# Patient Record
Sex: Male | Born: 1965 | Race: Black or African American | Hispanic: No | Marital: Single | Smoking: Never smoker
Health system: Southern US, Community
[De-identification: ages and names within clinical notes are randomized; demographics above are authoritative.]

---

## 2016-10-01 ENCOUNTER — Emergency Department (HOSPITAL_BASED_OUTPATIENT_CLINIC_OR_DEPARTMENT_OTHER)
Admission: EM | Admit: 2016-10-01 | Discharge: 2016-10-01 | Disposition: A | Discharge status: Home / Self Care | Payer: BLUE CROSS/BLUE SHIELD | Attending: Emergency Medicine | Admitting: Emergency Medicine

## 2016-10-01 ENCOUNTER — Encounter (HOSPITAL_BASED_OUTPATIENT_CLINIC_OR_DEPARTMENT_OTHER): Payer: Self-pay | Admitting: *Deleted

## 2016-10-01 DIAGNOSIS — M791 Myalgia: Secondary | ICD-10-CM | POA: Diagnosis not present

## 2016-10-01 DIAGNOSIS — N390 Urinary tract infection, site not specified: Secondary | ICD-10-CM | POA: Diagnosis not present

## 2016-10-01 DIAGNOSIS — N3000 Acute cystitis without hematuria: Secondary | ICD-10-CM

## 2016-10-01 LAB — URINALYSIS, MICROSCOPIC (REFLEX)

## 2016-10-01 LAB — URINALYSIS, ROUTINE W REFLEX MICROSCOPIC
Glucose, UA: NEGATIVE mg/dL
Ketones, ur: 15 mg/dL — AB
Nitrite: POSITIVE — AB
PROTEIN: 100 mg/dL — AB
Specific Gravity, Urine: 1.04 — ABNORMAL HIGH (ref 1.005–1.030)
pH: 6 (ref 5.0–8.0)

## 2016-10-01 MED ORDER — ACETAMINOPHEN 500 MG PO TABS
1000.0000 mg | ORAL_TABLET | Freq: Once | ORAL | Status: AC
  Administered 2016-10-01: 1000 mg via ORAL
  Filled 2016-10-01: qty 2

## 2016-10-01 MED ORDER — CIPROFLOXACIN HCL 500 MG PO TABS: 500.0000 mg | ORAL_TABLET | Freq: Two times a day (BID) | ORAL | 0 refills | Status: AC

## 2016-10-01 NOTE — Discharge Instructions (Signed)
Cipro as prescribed.  Tylenol 1000 mg rotated with ibuprofen 600 mg every 4 hours as needed for fever.  Drink plenty of fluids and get plenty of rest.  Return to the emergency department if your symptoms significantly worsen or change.

## 2016-10-01 NOTE — ED Triage Notes (Signed)
Body aches and dark urine x 4 days.

## 2016-10-01 NOTE — ED Provider Notes (Signed)
[facility]   CSN: [id number] Arrival date & time: 10/01/16  1805  By signing my name below, I, Diona BrownerJennifer Gorman, attest that this documentation has been prepared under the direction and in the presence of Geoffery Lyonselo, Osmar Howton, MD. Electronically Signed: Diona BrownerJennifer Gorman, ED Scribe. 10/01/16. 7:01 PM.  History   Chief Complaint Chief Complaint  Patient presents with  . Generalized Body Aches    HPI Darryl Fritz is a 51 y.o. male who presents to the Emergency Department complaining of body aches since Monday, 09/27/16. Pt originally thought he was getting sick. Associated sx include dark urine, chills, HA, lower abdominal pain, back pain, and fever. He was constipated for the last few days and notes relief yesterday. He had a tick on the back side of his right leg ~ a month ago, but hasn't seen any one him since. No medications taken at home. No past surgeries noted. Pt denies dysuria, and difficulty urinating.  The history is provided by the patient. No language interpreter was used.    History reviewed. No pertinent past medical history.  There are no active problems to display for this patient.   History reviewed. No pertinent surgical history.     Home Medications    Prior to Admission medications   Not on File    Family History No family history on file.  Social History Social History  Substance Use Topics  . Smoking status: Never Smoker  . Smokeless tobacco: Never Used  . Alcohol use Yes     Allergies   Patient has no known allergies.   Review of Systems Review of Systems  Constitutional: Positive for chills and fever.  Gastrointestinal: Positive for abdominal pain.  Genitourinary: Negative for difficulty urinating and dysuria.  Musculoskeletal: Positive for arthralgias, back pain and myalgias.  Neurological: Positive for headaches.  All other systems reviewed and are negative.    Physical Exam Updated Vital Signs BP 133/85    Pulse 90   Temp (!) 100.5 F (38.1 C) (Oral)   Resp 20   Ht 5\' 8"  (1.727 m)   Wt 187 lb (84.8 kg)   SpO2 99%   BMI 28.43 kg/m   Physical Exam  Constitutional: He is oriented to person, place, and time. He appears well-developed and well-nourished.  HENT:  Head: Normocephalic and atraumatic.  Eyes: EOM are normal.  Neck: Normal range of motion.  Cardiovascular: Normal rate, regular rhythm, normal heart sounds and intact distal pulses.   Pulmonary/Chest: Effort normal and breath sounds normal. No respiratory distress.  Abdominal: Soft. He exhibits no distension. There is no tenderness.  Musculoskeletal: Normal range of motion.  Neurological: He is alert and oriented to person, place, and time.  Skin: Skin is warm and dry.  Psychiatric: He has a normal mood and affect. Judgment normal.  Nursing note and vitals reviewed.    ED Treatments / Results  DIAGNOSTIC STUDIES: Oxygen Saturation is 99% on RA, normal by my interpretation.   COORDINATION OF CARE: 7:01 PM-Discussed next steps with pt which includes taking Cipro to help with his sx. Tylenol to help his fever and to drink plenty of fluids. Pt verbalized understanding and is agreeable with the plan.   Labs (all labs ordered are listed, but only abnormal results are displayed) Labs Reviewed  URINALYSIS, ROUTINE W REFLEX MICROSCOPIC - Abnormal; Notable for the following:       Result Value   Color, Urine ORANGE (*)    APPearance CLOUDY (*)  Specific Gravity, Urine 1.040 (*)    Hgb urine dipstick MODERATE (*)    Bilirubin Urine MODERATE (*)    Ketones, ur 15 (*)    Protein, ur 100 (*)    Nitrite POSITIVE (*)    Leukocytes, UA SMALL (*)    All other components within normal limits  URINALYSIS, MICROSCOPIC (REFLEX) - Abnormal; Notable for the following:    Bacteria, UA FEW (*)    Squamous Epithelial / LPF 0-5 (*)    All other components within normal limits    EKG  EKG Interpretation None       Radiology No  results found.  Procedures Procedures (including critical care time)  Medications Ordered in ED Medications  acetaminophen (TYLENOL) tablet 1,000 mg (1,000 mg Oral Given 10/01/16 1814)     Initial Impression / Assessment and Plan / ED Course  I have reviewed the triage vital signs and the nursing notes.  Pertinent labs & imaging results that were available during my care of the patient were reviewed by me and considered in my medical decision making (see chart for details).  Patient presents with pelvic discomfort and fever. He noticed today that his urine became somewhat dark. His UA is suggestive of a urinary tract infection. He will be treated with Cipro and follow-up as needed. The patient is febrile here in the ER, however his vitals are otherwise stable and he does not appear toxic or significantly ill.  Final Clinical Impressions(s) / ED Diagnoses   Final diagnoses:  None    New Prescriptions New Prescriptions   No medications on file   I personally performed the services described in this documentation, which was scribed in my presence. The recorded information has been reviewed and is accurate.       Geoffery Lyons, MD 10/01/16 2128

## 2017-03-03 DIAGNOSIS — K921 Melena: Secondary | ICD-10-CM | POA: Diagnosis not present

## 2017-03-03 DIAGNOSIS — R3912 Poor urinary stream: Secondary | ICD-10-CM | POA: Diagnosis not present

## 2017-03-03 DIAGNOSIS — E782 Mixed hyperlipidemia: Secondary | ICD-10-CM | POA: Diagnosis not present

## 2017-03-03 DIAGNOSIS — Z0000 Encounter for general adult medical examination without abnormal findings: Secondary | ICD-10-CM | POA: Diagnosis not present

## 2017-08-30 ENCOUNTER — Emergency Department (HOSPITAL_BASED_OUTPATIENT_CLINIC_OR_DEPARTMENT_OTHER): Payer: BLUE CROSS/BLUE SHIELD

## 2017-08-30 ENCOUNTER — Encounter (HOSPITAL_BASED_OUTPATIENT_CLINIC_OR_DEPARTMENT_OTHER): Payer: Self-pay | Admitting: Emergency Medicine

## 2017-08-30 ENCOUNTER — Other Ambulatory Visit: Payer: Self-pay

## 2017-08-30 ENCOUNTER — Emergency Department (HOSPITAL_BASED_OUTPATIENT_CLINIC_OR_DEPARTMENT_OTHER)
Admission: EM | Admit: 2017-08-30 | Discharge: 2017-08-30 | Disposition: A | Discharge status: Home / Self Care | Payer: BLUE CROSS/BLUE SHIELD | Attending: Emergency Medicine | Admitting: Emergency Medicine

## 2017-08-30 DIAGNOSIS — M79661 Pain in right lower leg: Secondary | ICD-10-CM | POA: Diagnosis not present

## 2017-08-30 DIAGNOSIS — S96911A Strain of unspecified muscle and tendon at ankle and foot level, right foot, initial encounter: Secondary | ICD-10-CM | POA: Diagnosis not present

## 2017-08-30 DIAGNOSIS — Y9367 Activity, basketball: Secondary | ICD-10-CM | POA: Insufficient documentation

## 2017-08-30 DIAGNOSIS — Y999 Unspecified external cause status: Secondary | ICD-10-CM | POA: Insufficient documentation

## 2017-08-30 DIAGNOSIS — X58XXXA Exposure to other specified factors, initial encounter: Secondary | ICD-10-CM | POA: Insufficient documentation

## 2017-08-30 DIAGNOSIS — M25571 Pain in right ankle and joints of right foot: Secondary | ICD-10-CM | POA: Diagnosis not present

## 2017-08-30 DIAGNOSIS — Y929 Unspecified place or not applicable: Secondary | ICD-10-CM | POA: Insufficient documentation

## 2017-08-30 DIAGNOSIS — S86011A Strain of right Achilles tendon, initial encounter: Secondary | ICD-10-CM | POA: Insufficient documentation

## 2017-08-30 DIAGNOSIS — S8991XA Unspecified injury of right lower leg, initial encounter: Secondary | ICD-10-CM | POA: Diagnosis not present

## 2017-08-30 DIAGNOSIS — S99911A Unspecified injury of right ankle, initial encounter: Secondary | ICD-10-CM | POA: Diagnosis not present

## 2017-08-30 NOTE — ED Triage Notes (Signed)
Reports ankle and calf pain that began yesterday after playing basketball.  Reports he heard a "pop".

## 2017-08-30 NOTE — ED Provider Notes (Signed)
[facility] EMERGENCY DEPARTMENT Provider Note   CSN: [id number] Arrival date & time: 08/30/17  1031     History   Chief Complaint Chief Complaint  Patient presents with  . Leg Injury   HPI Patient is a previously healthy 52 year old male who presents with right ankle pain.   Patient reports that he was playing basketball yesterday when he felt and heard a pop in his right ankle.  He denies falling or hitting his head.  He has been able to ambulate, although it is very painful to his calf and ankle.  Marland KitchenHistory reviewed. No pertinent past medical history.   There are no active problems to display for this patient.   History reviewed. No pertinent surgical history.      Home Medications    Prior to Admission medications   Medication Sig Start Date End Date Taking? Authorizing Provider  ciprofloxacin (CIPRO) 500 MG tablet Take 1 tablet (500 mg total) by mouth 2 (two) times daily. One po bid x 7 days 10/01/16   Geoffery Lyons, MD    Family History History reviewed. No pertinent family history.  Social History Social History   Tobacco Use  . Smoking status: Never Smoker  . Smokeless tobacco: Never Used  Substance Use Topics  . Alcohol use: Yes  . Drug use: No     Allergies   Patient has no known allergies.   Review of Systems Review of Systems  Constitutional: Negative for chills and fever.  HENT: Negative for ear pain and sore throat.   Eyes: Negative for pain and visual disturbance.  Respiratory: Negative for cough and shortness of breath.   Cardiovascular: Negative for chest pain and palpitations.  Gastrointestinal: Negative for abdominal pain and vomiting.  Genitourinary: Negative for dysuria and hematuria.  Musculoskeletal: Positive for gait problem and joint swelling. Negative for arthralgias and back pain.  Skin: Negative for color change and rash.  Neurological: Negative for seizures and syncope.  All other systems reviewed and are  negative.    Physical Exam Updated Vital Signs BP 129/89 (BP Location: Right Arm)   Pulse 60   Temp 98 F (36.7 C) (Oral)   Resp 16   Ht  (1.727 m)   Wt 84.8 kg (187 lb)   SpO2 100%   BMI 28.43 kg/m   Physical Exam  Constitutional: He appears well-developed and well-nourished.  HENT:  Head: Normocephalic and atraumatic.  Eyes: Conjunctivae are normal.  Neck: Neck supple.  Cardiovascular: Normal rate and regular rhythm.  No murmur heard. Pulmonary/Chest: Effort normal and breath sounds normal. No respiratory distress.  Abdominal: Soft. There is no tenderness.  Musculoskeletal: He exhibits deformity (swelling to right posterior ankle. TTP calcaneous and achilles tendon. Positive Thompsons on right compared to left). He exhibits no edema.  Neurological: He is alert.  Skin: Skin is warm and dry.  Psychiatric: He has a normal mood and affect.  Nursing note and vitals reviewed.    ED Treatments / Results  Labs (all labs ordered are listed, but only abnormal results are displayed) Labs Reviewed - No data to display  EKG None  Radiology Dg Tibia/fibula Right  Result Date: 08/30/2017 CLINICAL DATA:  Pain above right Achilles. EXAM: RIGHT TIBIA AND FIBULA - 2 VIEW COMPARISON:  None FINDINGS: No fracture or dislocation identified. There is thickening of the Achilles tendon and there is fat stranding within Kager's fat. Findings a concerning for of acute injury. IMPRESSION: 1. No acute fracture or dislocation. 2. Thickening  of the Achilles tendon with surrounding fat stranding concerning for acute injury. Electronically Signed   By: Signa Kell M.D.   On: 08/30/2017 11:05   Dg Ankle Complete Right  Result Date: 08/30/2017 CLINICAL DATA:  Injured playing basketball last night, pain above RIGHT Achilles EXAM: RIGHT ANKLE - COMPLETE 3+ VIEW COMPARISON:  None FINDINGS: Osseous mineralization normal. Joint spaces preserved. Small patellar spur at patellar tendon insertion.  Striated linear lucencies are seen within the anterior cortex of the RIGHT tibia compatible without definite cortical discontinuity or overlying periosteal reaction most consistent with stress related injury/changes of the anterior cortex. No acute fracture, dislocation or bone destruction. IMPRESSION: No acute ankle abnormalities are identified; if there is clinical concern for an Achilles injury recommend MR. Stress-related linear cortical lucencies at the anterior tibial cortex without completed fracture. Electronically Signed   By: Ulyses Southward M.D.   On: 08/30/2017 11:16    Procedures Procedures (including critical care time)  Medications Ordered in ED Medications - No data to display   Initial Impression / Assessment and Plan / ED Course  I have reviewed the triage vital signs and the nursing notes.  Pertinent labs & imaging results that were available during my care of the patient were reviewed by me and considered in my medical decision making (see chart for details).     Patient is a 52 year old male who presents with right ankle injury after playing basketball.  Exam as above, significant for swelling and positive Thompson's to right Achilles.  Radiographs negative for acute fracture, however inflammation noted about Achilles concerning for possible injury.  History and physical exam consistent with Achilles rupture.  Will place referral to orthopedics.  Patient placed in a posterior leg splint with plantar flexion.  He already has crutches at home and will use to continue to be nonweightbearing.  Motrin/Tylenol for pain.  Patient and plan of care discussed with Attending physician, Dr. Rush Landmark.    Final Clinical Impressions(s) / ED Diagnoses   Final diagnoses:  Rupture of right Achilles tendon, initial encounter    ED Discharge Orders    None       Wynelle Cleveland, MD 08/30/17 1828    Tegeler, Canary Brim, MD 08/30/17 2029

## 2017-09-02 ENCOUNTER — Encounter (INDEPENDENT_AMBULATORY_CARE_PROVIDER_SITE_OTHER): Payer: Self-pay | Admitting: Orthopaedic Surgery

## 2017-09-02 ENCOUNTER — Ambulatory Visit (INDEPENDENT_AMBULATORY_CARE_PROVIDER_SITE_OTHER): Payer: BLUE CROSS/BLUE SHIELD | Admitting: Orthopaedic Surgery

## 2017-09-02 DIAGNOSIS — M25571 Pain in right ankle and joints of right foot: Secondary | ICD-10-CM | POA: Diagnosis not present

## 2017-09-02 DIAGNOSIS — M79604 Pain in right leg: Secondary | ICD-10-CM | POA: Diagnosis not present

## 2017-09-02 NOTE — Progress Notes (Signed)
   Office Visit Note   Patient: Darryl Fritz           Date of Birth: [birth year]           MRN: [id number] Visit Date: 09/02/2017              Requested by: Iva Boop, MD [address] [place], [place] [address] PCP: Iva Boop, MD   Assessment & Plan: Visit Diagnoses:  1. Right leg pain   2. Pain in right ankle and joints of right foot     Plan: Impression is Achilles rupture versus myotendinous junction rupture.  We will obtain a stat MRI to assess for this.  Plantarflexion splint and nonweightbearing with crutches.  Follow-up after the MRI.  Follow-Up Instructions: Return if symptoms worsen or fail to improve.   Orders:  Orders Placed This Encounter  Procedures  . MR TIBIA FIBULA RIGHT WO CONTRAST  . MR Ankle Right w/o contrast   No orders of the defined types were placed in this encounter.     Procedures: No procedures performed   Clinical Data: No additional findings.   Subjective: Chief Complaint  Patient presents with  . Right Ankle - Injury    DOI: Monday 08/29/17     Darryl Fritz is a 52 year old gentleman comes in with a suspected right acute Achilles rupture from playing basketball 4 days ago.  X-rays in the ED were negative.  He follows up today.  Denies any numbness and tingling.  He does have pain and weakness with plantarflexion   Review of Systems  Constitutional: Negative.   All other systems reviewed and are negative.    Objective: Vital Signs: There were no vitals taken for this visit.  Physical Exam  Constitutional: He is oriented to person, place, and time. He appears well-developed and well-nourished.  HENT:  Head: Normocephalic and atraumatic.  Eyes: Pupils are equal, round, and reactive to light.  Neck: Neck supple.  Pulmonary/Chest: Effort normal.  Abdominal: Soft.  Musculoskeletal: Normal range of motion.  Neurological: He is alert and oriented to person, place, and time.  Skin: Skin is warm.  Psychiatric: He has a normal  mood and affect. His behavior is normal. Judgment and thought content normal.  Nursing note and vitals reviewed.   Ortho Exam Right ankle exam shows tenderness at the level of the myotendinous junction.  Abnormal Thompson's test.  There is no obvious palpable defect of the Achilles tendon itself. Specialty Comments:  No specialty comments available.  Imaging: No results found.   PMFS History: There are no active problems to display for this patient.  History reviewed. No pertinent past medical history.  History reviewed. No pertinent family history.  History reviewed. No pertinent surgical history. Social History   Occupational History  . Not on file  Tobacco Use  . Smoking status: Never Smoker  . Smokeless tobacco: Never Used  Substance and Sexual Activity  . Alcohol use: Yes  . Drug use: No  . Sexual activity: Not on file

## 2017-09-06 ENCOUNTER — Ambulatory Visit
Admission: RE | Admit: 2017-09-06 | Discharge: 2017-09-06 | Disposition: A | Discharge status: Home / Self Care | Payer: BLUE CROSS/BLUE SHIELD | Source: Ambulatory Visit | Attending: Orthopaedic Surgery | Admitting: Orthopaedic Surgery

## 2017-09-06 DIAGNOSIS — R600 Localized edema: Secondary | ICD-10-CM | POA: Diagnosis not present

## 2017-09-06 DIAGNOSIS — M79604 Pain in right leg: Secondary | ICD-10-CM

## 2017-09-06 DIAGNOSIS — S86011A Strain of right Achilles tendon, initial encounter: Secondary | ICD-10-CM | POA: Diagnosis not present

## 2017-09-07 ENCOUNTER — Telehealth (INDEPENDENT_AMBULATORY_CARE_PROVIDER_SITE_OTHER): Payer: Self-pay | Admitting: Orthopaedic Surgery

## 2017-09-07 NOTE — Telephone Encounter (Signed)
Patient request a call with his MRI results

## 2017-09-08 NOTE — Telephone Encounter (Signed)
See message.

## 2017-09-08 NOTE — Telephone Encounter (Signed)
Left voice mail

## 2017-09-09 ENCOUNTER — Telehealth (INDEPENDENT_AMBULATORY_CARE_PROVIDER_SITE_OTHER): Payer: Self-pay | Admitting: Orthopaedic Surgery

## 2017-09-09 NOTE — Telephone Encounter (Signed)
Pt called back to speak with Dr.Xu

## 2017-09-09 NOTE — Telephone Encounter (Signed)
See message below °

## 2017-09-14 ENCOUNTER — Ambulatory Visit (INDEPENDENT_AMBULATORY_CARE_PROVIDER_SITE_OTHER): Payer: BLUE CROSS/BLUE SHIELD | Admitting: Orthopaedic Surgery

## 2017-09-14 ENCOUNTER — Encounter (INDEPENDENT_AMBULATORY_CARE_PROVIDER_SITE_OTHER): Payer: Self-pay | Admitting: Orthopaedic Surgery

## 2017-09-14 DIAGNOSIS — S86011A Strain of right Achilles tendon, initial encounter: Secondary | ICD-10-CM

## 2017-09-14 MED ORDER — CYCLOBENZAPRINE HCL 5 MG PO TABS: 5.0000 mg | ORAL_TABLET | Freq: Three times a day (TID) | ORAL | 3 refills | Status: AC | PRN

## 2017-09-14 NOTE — Progress Notes (Signed)
   Office Visit Note   Patient: Darryl Fritz           Date of Birth: Jul 27, 1965           MRN: [id number] Visit Date: 09/14/2017              Requested by: Iva Boop, MD [address] [place], [place] [address] PCP: Iva Boop, MD   Assessment & Plan: Visit Diagnoses:  1. Partial tear of right Achilles tendon, initial encounter     Plan: MRI findings are consistent with a high-grade partial tear near the myotendinous junction.  Results were reviewed with the patient and I think this would be treated better without surgery given the characteristics of the tear.  We will transition him to a Cam boot with 2 large he will let us to keep his ankle in plantarflexion.  Prescription for Flexeril.  I stressed the importance of being nonweightbearing with crutches.  I will see him back in 2 weeks for recheck.  Anticipate initiating physical therapy for gentle range of motion at that time.  Anticipate doing strengthening at 8 weeks.  Patient instructed to take a baby aspirin daily to prevent DVT.  Follow-Up Instructions: Return in about 2 weeks (around 09/28/2017).   Orders:  No orders of the defined types were placed in this encounter.  Meds ordered this encounter  Medications  . cyclobenzaprine (FLEXERIL) 5 MG tablet    Sig: Take 1-2 tablets (5-10 mg total) by mouth 3 (three) times daily as needed for muscle spasms.    Dispense:  30 tablet    Refill:  3      Procedures: No procedures performed   Clinical Data: No additional findings.   Subjective: Chief Complaint  Patient presents with  . Right Leg - Pain, Follow-up    [name] follows up today for his MRI review.  He has been weightbearing on his splint without crutches.   Review of Systems  Constitutional: Negative.   All other systems reviewed and are negative.    Objective: Vital Signs: There were no vitals taken for this visit.  Physical Exam  Constitutional: He is oriented to person, place, and time.  He appears well-developed and well-nourished.  Pulmonary/Chest: Effort normal.  Abdominal: Soft.  Neurological: He is alert and oriented to person, place, and time.  Skin: Skin is warm.  Psychiatric: He has a normal mood and affect. His behavior is normal. Judgment and thought content normal.  Nursing note and vitals reviewed.   Ortho Exam Right calf and ankle exam shows no significant tenderness in the calf area.  No significant swelling. Specialty Comments:  No specialty comments available.  Imaging: No results found.   PMFS History: There are no active problems to display for this patient.  History reviewed. No pertinent past medical history.  History reviewed. No pertinent family history.  History reviewed. No pertinent surgical history. Social History   Occupational History  . Not on file  Tobacco Use  . Smoking status: Never Smoker  . Smokeless tobacco: Never Used  Substance and Sexual Activity  . Alcohol use: Yes  . Drug use: No  . Sexual activity: Not on file

## 2017-09-28 ENCOUNTER — Ambulatory Visit (INDEPENDENT_AMBULATORY_CARE_PROVIDER_SITE_OTHER): Payer: BLUE CROSS/BLUE SHIELD | Admitting: Orthopaedic Surgery

## 2017-09-29 ENCOUNTER — Ambulatory Visit (INDEPENDENT_AMBULATORY_CARE_PROVIDER_SITE_OTHER): Payer: BLUE CROSS/BLUE SHIELD | Admitting: Orthopaedic Surgery

## 2017-09-29 DIAGNOSIS — S86011A Strain of right Achilles tendon, initial encounter: Secondary | ICD-10-CM

## 2017-09-29 NOTE — Progress Notes (Signed)
   Office Visit Note   Patient: Darryl ReamsDonald Mcbreen           Date of Birth: 10/06/1965           MRN: [id number] Visit Date: 09/29/2017              Requested by: Iva BoopVia, Kevin, MD [address] [place], [place] [address] PCP: Iva BoopVia, Kevin, MD   Assessment & Plan: Visit Diagnoses:  1. Partial tear of right Achilles tendon, initial encounter     Plan: At this point we will get him into physical therapy for strengthening.  He may continue to weight-bear in a Cam walker.  I did give him an additional heel lift.  Recheck in 1 month.  Follow-Up Instructions: Return in about 1 month (around 10/27/2017).   Orders:  No orders of the defined types were placed in this encounter.  No orders of the defined types were placed in this encounter.     Procedures: No procedures performed   Clinical Data: No additional findings.   Subjective: Chief Complaint  Patient presents with  . Right Achilles Tendon - Follow-up    Patient is a 52 year old gentleman who is one-month status post high-grade partial tear of his right Achilles tendon.  He is overall doing well.  He is weightbearing in the cam walker.  Denies any pain.  He takes occasional Aleve.  Denies any numbness and tingling.   Review of Systems   Objective: Vital Signs: There were no vitals taken for this visit.  Physical Exam  Ortho Exam Right calf exam shows no significant swelling.  He has improving plantar flexion strength. Specialty Comments:  No specialty comments available.  Imaging: No results found.   PMFS History: There are no active problems to display for this patient.  No past medical history on file.  No family history on file.  No past surgical history on file. Social History   Occupational History  . Not on file  Tobacco Use  . Smoking status: Never Smoker  . Smokeless tobacco: Never Used  Substance and Sexual Activity  . Alcohol use: Yes  . Drug use: No  . Sexual activity: Not on file

## 2017-10-10 DIAGNOSIS — M25671 Stiffness of right ankle, not elsewhere classified: Secondary | ICD-10-CM | POA: Diagnosis not present

## 2017-10-10 DIAGNOSIS — M25571 Pain in right ankle and joints of right foot: Secondary | ICD-10-CM | POA: Diagnosis not present

## 2017-10-10 DIAGNOSIS — M25471 Effusion, right ankle: Secondary | ICD-10-CM | POA: Diagnosis not present

## 2017-10-10 DIAGNOSIS — R262 Difficulty in walking, not elsewhere classified: Secondary | ICD-10-CM | POA: Diagnosis not present

## 2017-10-12 DIAGNOSIS — M25671 Stiffness of right ankle, not elsewhere classified: Secondary | ICD-10-CM | POA: Diagnosis not present

## 2017-10-12 DIAGNOSIS — M25571 Pain in right ankle and joints of right foot: Secondary | ICD-10-CM | POA: Diagnosis not present

## 2017-10-12 DIAGNOSIS — M25471 Effusion, right ankle: Secondary | ICD-10-CM | POA: Diagnosis not present

## 2017-10-12 DIAGNOSIS — R262 Difficulty in walking, not elsewhere classified: Secondary | ICD-10-CM | POA: Diagnosis not present

## 2017-10-14 DIAGNOSIS — M25671 Stiffness of right ankle, not elsewhere classified: Secondary | ICD-10-CM | POA: Diagnosis not present

## 2017-10-14 DIAGNOSIS — M25471 Effusion, right ankle: Secondary | ICD-10-CM | POA: Diagnosis not present

## 2017-10-14 DIAGNOSIS — M25571 Pain in right ankle and joints of right foot: Secondary | ICD-10-CM | POA: Diagnosis not present

## 2017-10-14 DIAGNOSIS — R262 Difficulty in walking, not elsewhere classified: Secondary | ICD-10-CM | POA: Diagnosis not present

## 2017-10-17 DIAGNOSIS — M25471 Effusion, right ankle: Secondary | ICD-10-CM | POA: Diagnosis not present

## 2017-10-17 DIAGNOSIS — R262 Difficulty in walking, not elsewhere classified: Secondary | ICD-10-CM | POA: Diagnosis not present

## 2017-10-17 DIAGNOSIS — M25571 Pain in right ankle and joints of right foot: Secondary | ICD-10-CM | POA: Diagnosis not present

## 2017-10-17 DIAGNOSIS — M25671 Stiffness of right ankle, not elsewhere classified: Secondary | ICD-10-CM | POA: Diagnosis not present

## 2017-10-19 DIAGNOSIS — M25671 Stiffness of right ankle, not elsewhere classified: Secondary | ICD-10-CM | POA: Diagnosis not present

## 2017-10-19 DIAGNOSIS — R262 Difficulty in walking, not elsewhere classified: Secondary | ICD-10-CM | POA: Diagnosis not present

## 2017-10-19 DIAGNOSIS — M25571 Pain in right ankle and joints of right foot: Secondary | ICD-10-CM | POA: Diagnosis not present

## 2017-10-19 DIAGNOSIS — M25471 Effusion, right ankle: Secondary | ICD-10-CM | POA: Diagnosis not present

## 2017-10-21 DIAGNOSIS — M25571 Pain in right ankle and joints of right foot: Secondary | ICD-10-CM | POA: Diagnosis not present

## 2017-10-21 DIAGNOSIS — R262 Difficulty in walking, not elsewhere classified: Secondary | ICD-10-CM | POA: Diagnosis not present

## 2017-10-21 DIAGNOSIS — M25471 Effusion, right ankle: Secondary | ICD-10-CM | POA: Diagnosis not present

## 2017-10-21 DIAGNOSIS — M25671 Stiffness of right ankle, not elsewhere classified: Secondary | ICD-10-CM | POA: Diagnosis not present

## 2017-10-24 DIAGNOSIS — M25671 Stiffness of right ankle, not elsewhere classified: Secondary | ICD-10-CM | POA: Diagnosis not present

## 2017-10-24 DIAGNOSIS — M25571 Pain in right ankle and joints of right foot: Secondary | ICD-10-CM | POA: Diagnosis not present

## 2017-10-24 DIAGNOSIS — R262 Difficulty in walking, not elsewhere classified: Secondary | ICD-10-CM | POA: Diagnosis not present

## 2017-10-24 DIAGNOSIS — M25471 Effusion, right ankle: Secondary | ICD-10-CM | POA: Diagnosis not present

## 2017-10-26 DIAGNOSIS — M25671 Stiffness of right ankle, not elsewhere classified: Secondary | ICD-10-CM | POA: Diagnosis not present

## 2017-10-26 DIAGNOSIS — M25571 Pain in right ankle and joints of right foot: Secondary | ICD-10-CM | POA: Diagnosis not present

## 2017-10-26 DIAGNOSIS — M25471 Effusion, right ankle: Secondary | ICD-10-CM | POA: Diagnosis not present

## 2017-10-26 DIAGNOSIS — R262 Difficulty in walking, not elsewhere classified: Secondary | ICD-10-CM | POA: Diagnosis not present

## 2017-10-31 DIAGNOSIS — R262 Difficulty in walking, not elsewhere classified: Secondary | ICD-10-CM | POA: Diagnosis not present

## 2017-10-31 DIAGNOSIS — M25471 Effusion, right ankle: Secondary | ICD-10-CM | POA: Diagnosis not present

## 2017-10-31 DIAGNOSIS — M25671 Stiffness of right ankle, not elsewhere classified: Secondary | ICD-10-CM | POA: Diagnosis not present

## 2017-10-31 DIAGNOSIS — M25571 Pain in right ankle and joints of right foot: Secondary | ICD-10-CM | POA: Diagnosis not present

## 2017-11-01 ENCOUNTER — Ambulatory Visit (INDEPENDENT_AMBULATORY_CARE_PROVIDER_SITE_OTHER): Payer: BLUE CROSS/BLUE SHIELD | Admitting: Orthopaedic Surgery

## 2017-11-02 DIAGNOSIS — M25571 Pain in right ankle and joints of right foot: Secondary | ICD-10-CM | POA: Diagnosis not present

## 2017-11-02 DIAGNOSIS — M25671 Stiffness of right ankle, not elsewhere classified: Secondary | ICD-10-CM | POA: Diagnosis not present

## 2017-11-02 DIAGNOSIS — R262 Difficulty in walking, not elsewhere classified: Secondary | ICD-10-CM | POA: Diagnosis not present

## 2017-11-02 DIAGNOSIS — M25471 Effusion, right ankle: Secondary | ICD-10-CM | POA: Diagnosis not present

## 2017-11-07 DIAGNOSIS — R262 Difficulty in walking, not elsewhere classified: Secondary | ICD-10-CM | POA: Diagnosis not present

## 2017-11-07 DIAGNOSIS — M25671 Stiffness of right ankle, not elsewhere classified: Secondary | ICD-10-CM | POA: Diagnosis not present

## 2017-11-07 DIAGNOSIS — M25471 Effusion, right ankle: Secondary | ICD-10-CM | POA: Diagnosis not present

## 2017-11-07 DIAGNOSIS — M25571 Pain in right ankle and joints of right foot: Secondary | ICD-10-CM | POA: Diagnosis not present

## 2017-11-09 ENCOUNTER — Encounter (INDEPENDENT_AMBULATORY_CARE_PROVIDER_SITE_OTHER): Payer: Self-pay | Admitting: Orthopaedic Surgery

## 2017-11-09 ENCOUNTER — Ambulatory Visit (INDEPENDENT_AMBULATORY_CARE_PROVIDER_SITE_OTHER): Payer: BLUE CROSS/BLUE SHIELD | Admitting: Orthopaedic Surgery

## 2017-11-09 DIAGNOSIS — S86011D Strain of right Achilles tendon, subsequent encounter: Secondary | ICD-10-CM | POA: Diagnosis not present

## 2017-11-09 DIAGNOSIS — M25471 Effusion, right ankle: Secondary | ICD-10-CM | POA: Diagnosis not present

## 2017-11-09 DIAGNOSIS — M25671 Stiffness of right ankle, not elsewhere classified: Secondary | ICD-10-CM | POA: Diagnosis not present

## 2017-11-09 DIAGNOSIS — M25571 Pain in right ankle and joints of right foot: Secondary | ICD-10-CM | POA: Diagnosis not present

## 2017-11-09 DIAGNOSIS — R262 Difficulty in walking, not elsewhere classified: Secondary | ICD-10-CM | POA: Diagnosis not present

## 2017-11-09 NOTE — Progress Notes (Signed)
   Office Visit Note   Patient: Darryl Fritz           Date of Birth: 08/22/1965           MRN: [id number] Visit Date: 11/09/2017              Requested by: Iva BoopVia, Kevin, MD [address] [place] [address] PCP: Iva BoopVia, Kevin, MD   Assessment & Plan: Visit Diagnoses:  1. Partial Achilles tendon tear, right, subsequent encounter     Plan: 2-1/2 months status post artery partial thickness tear proximal Achilles on the right.  Patient is doing very well and can wean from formal therapy to a home exercise program.  He will follow-up with us as needed.  Call with concerns or questions in the meantime.  Follow-Up Instructions: Return if symptoms worsen or fail to improve.   Orders:  No orders of the defined types were placed in this encounter.  No orders of the defined types were placed in this encounter.     Procedures: No procedures performed   Clinical Data: No additional findings.   Subjective: Chief Complaint  Patient presents with  . Right Foot - Follow-up    HPI patient is a pleasant 52 year old gentleman who presents to our clinic today about 2-1/2 weeks out high-grade partial tearing proximal Achilles.  He has since weaned out of his cam walker into a regular shoe weightbearing as tolerated with no pain.  He is still in formal physical therapy where he has regained near full motion and strength.  Occasional pain to the distal Achilles but nothing more.  Overall much improved.  Review of Systems as detailed in HPI.  All others reviewed and are negative.   Objective: Vital Signs: There were no vitals taken for this visit.  Physical Exam well-developed well-nourished gentleman in no acute distress.  Alert and oriented x3.    Ortho Exam exam of the right lower extremity reveals no pain to the Achilles.  Full motion and strength.    Specialty Comments:  No specialty comments available.  Imaging: No new imaging   PMFS History: Patient Active Problem  List   Diagnosis Date Noted  . Partial Achilles tendon tear, right, subsequent encounter 11/09/2017   History reviewed. No pertinent past medical history.  History reviewed. No pertinent family history.  History reviewed. No pertinent surgical history. Social History   Occupational History  . Not on file  Tobacco Use  . Smoking status: Never Smoker  . Smokeless tobacco: Never Used  Substance and Sexual Activity  . Alcohol use: Yes  . Drug use: No  . Sexual activity: Not on file

## 2018-03-07 DIAGNOSIS — Z125 Encounter for screening for malignant neoplasm of prostate: Secondary | ICD-10-CM | POA: Diagnosis not present

## 2018-03-07 DIAGNOSIS — Z0000 Encounter for general adult medical examination without abnormal findings: Secondary | ICD-10-CM | POA: Diagnosis not present

## 2018-03-07 DIAGNOSIS — E782 Mixed hyperlipidemia: Secondary | ICD-10-CM | POA: Diagnosis not present

## 2018-05-09 DIAGNOSIS — Z01818 Encounter for other preprocedural examination: Secondary | ICD-10-CM | POA: Diagnosis not present

## 2018-06-08 DIAGNOSIS — D3A026 Benign carcinoid tumor of the rectum: Secondary | ICD-10-CM | POA: Diagnosis not present

## 2018-06-08 DIAGNOSIS — K648 Other hemorrhoids: Secondary | ICD-10-CM | POA: Diagnosis not present

## 2018-06-08 DIAGNOSIS — D122 Benign neoplasm of ascending colon: Secondary | ICD-10-CM | POA: Diagnosis not present

## 2018-06-08 DIAGNOSIS — Z1211 Encounter for screening for malignant neoplasm of colon: Secondary | ICD-10-CM | POA: Diagnosis not present

## 2018-06-08 DIAGNOSIS — D3A00 Benign carcinoid tumor of unspecified site: Secondary | ICD-10-CM | POA: Diagnosis not present

## 2018-06-08 DIAGNOSIS — K5730 Diverticulosis of large intestine without perforation or abscess without bleeding: Secondary | ICD-10-CM | POA: Diagnosis not present

## 2018-06-15 DIAGNOSIS — D122 Benign neoplasm of ascending colon: Secondary | ICD-10-CM | POA: Diagnosis not present

## 2018-06-15 DIAGNOSIS — D3A00 Benign carcinoid tumor of unspecified site: Secondary | ICD-10-CM | POA: Diagnosis not present

## 2019-04-23 DIAGNOSIS — R5381 Other malaise: Secondary | ICD-10-CM | POA: Diagnosis not present

## 2019-04-23 DIAGNOSIS — R05 Cough: Secondary | ICD-10-CM | POA: Diagnosis not present

## 2019-04-23 DIAGNOSIS — R6883 Chills (without fever): Secondary | ICD-10-CM | POA: Diagnosis not present

## 2019-04-23 DIAGNOSIS — M545 Low back pain: Secondary | ICD-10-CM | POA: Diagnosis not present

## 2019-04-24 DIAGNOSIS — Z20828 Contact with and (suspected) exposure to other viral communicable diseases: Secondary | ICD-10-CM | POA: Diagnosis not present

## 2019-07-05 ENCOUNTER — Ambulatory Visit: Payer: BC Managed Care – PPO | Attending: Internal Medicine

## 2019-07-05 ENCOUNTER — Ambulatory Visit: Payer: BLUE CROSS/BLUE SHIELD

## 2019-07-05 DIAGNOSIS — Z23 Encounter for immunization: Secondary | ICD-10-CM

## 2019-07-05 NOTE — Progress Notes (Signed)
   Covid-19 Vaccination Clinic  Name:  Darryl Fritz    MRN: [id number] DOB: June 01, 1965  07/05/2019  Mr. Darryl Fritz was observed post Covid-19 immunization for 15 minutes without incident. He was provided with Vaccine Information Sheet and instruction to access the V-Safe system.   Mr. Darryl Fritz was instructed to call 911 with any severe reactions post vaccine: Marland Kitchen Difficulty breathing  . Swelling of face and throat  . A fast heartbeat  . A bad rash all over body  . Dizziness and weakness   Immunizations Administered    Name Date Dose VIS Date Route   Pfizer COVID-19 Vaccine 07/05/2019 10:02 AM 0.3 mL 03/30/2019 Intramuscular   Manufacturer: [employer], [employer]   Lot: [id number]   NDC: [address]-7

## 2019-07-30 ENCOUNTER — Ambulatory Visit: Payer: BC Managed Care – PPO | Attending: Internal Medicine

## 2019-07-30 DIAGNOSIS — Z23 Encounter for immunization: Secondary | ICD-10-CM

## 2019-07-30 NOTE — Progress Notes (Signed)
   Covid-19 Vaccination Clinic  Name:  Ashleigh Arya    MRN: [id number] DOB: 12-02-1965  07/30/2019  Mr. Twilley was observed post Covid-19 immunization for 15 minutes without incident. He was provided with Vaccine Information Sheet and instruction to access the V-Safe system.   Mr. Czerwinski was instructed to call 911 with any severe reactions post vaccine: Marland Kitchen Difficulty breathing  . Swelling of face and throat  . A fast heartbeat  . A bad rash all over body  . Dizziness and weakness   Immunizations Administered    Name Date Dose VIS Date Route   Pfizer COVID-19 Vaccine 07/30/2019  9:24 AM 0.3 mL 03/30/2019 Intramuscular   Manufacturer: [employer], [employer]   Lot: [id number]   NDC: [address]-7

## 2020-04-07 IMAGING — CR DG ANKLE COMPLETE 3+V*R*
2 series · 2 of 2 positions shown · non-contrast
Comparison: None

CLINICAL DATA: Injured playing basketball last night, pain above
RIGHT Achilles

EXAM:
RIGHT ANKLE - COMPLETE 3+ VIEW

[t tib/fib ap right]
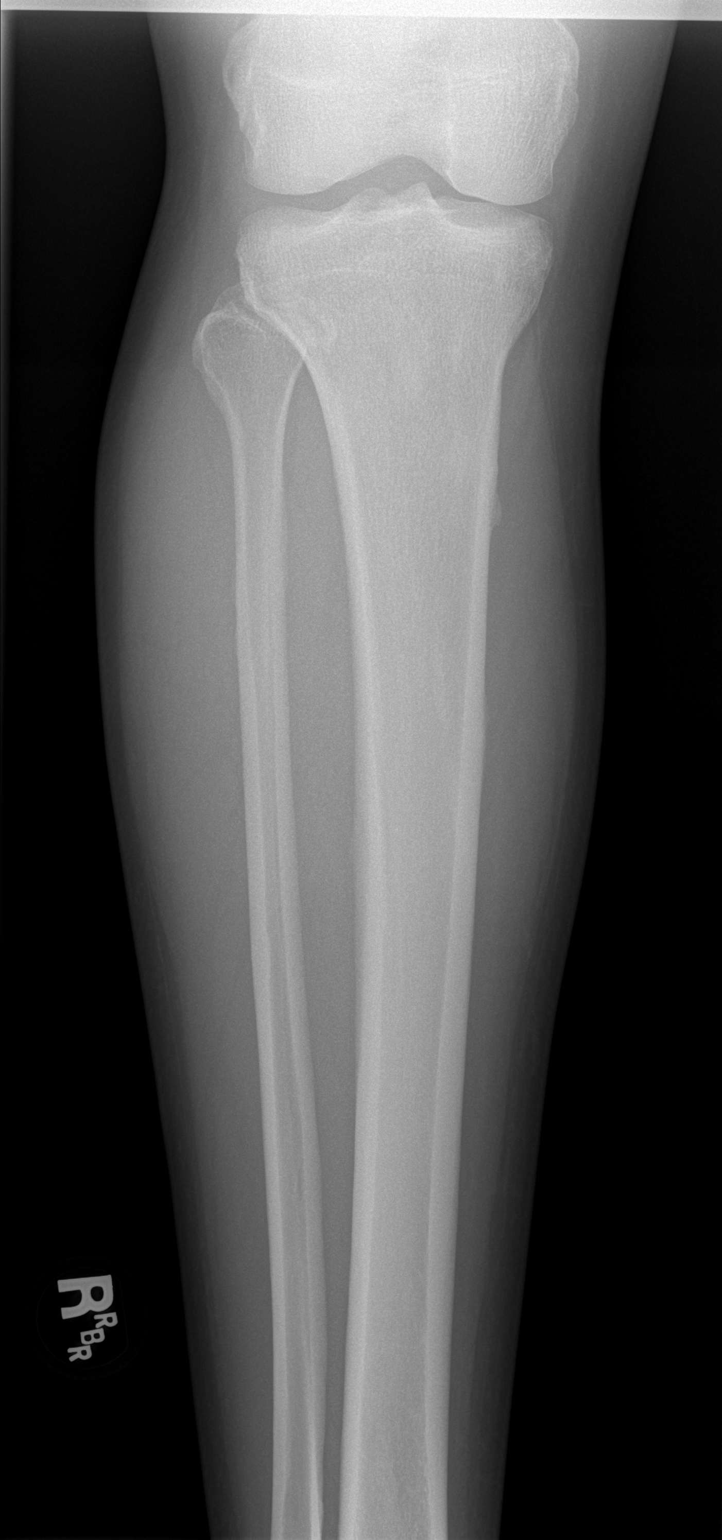

[t tib/fib lat right]
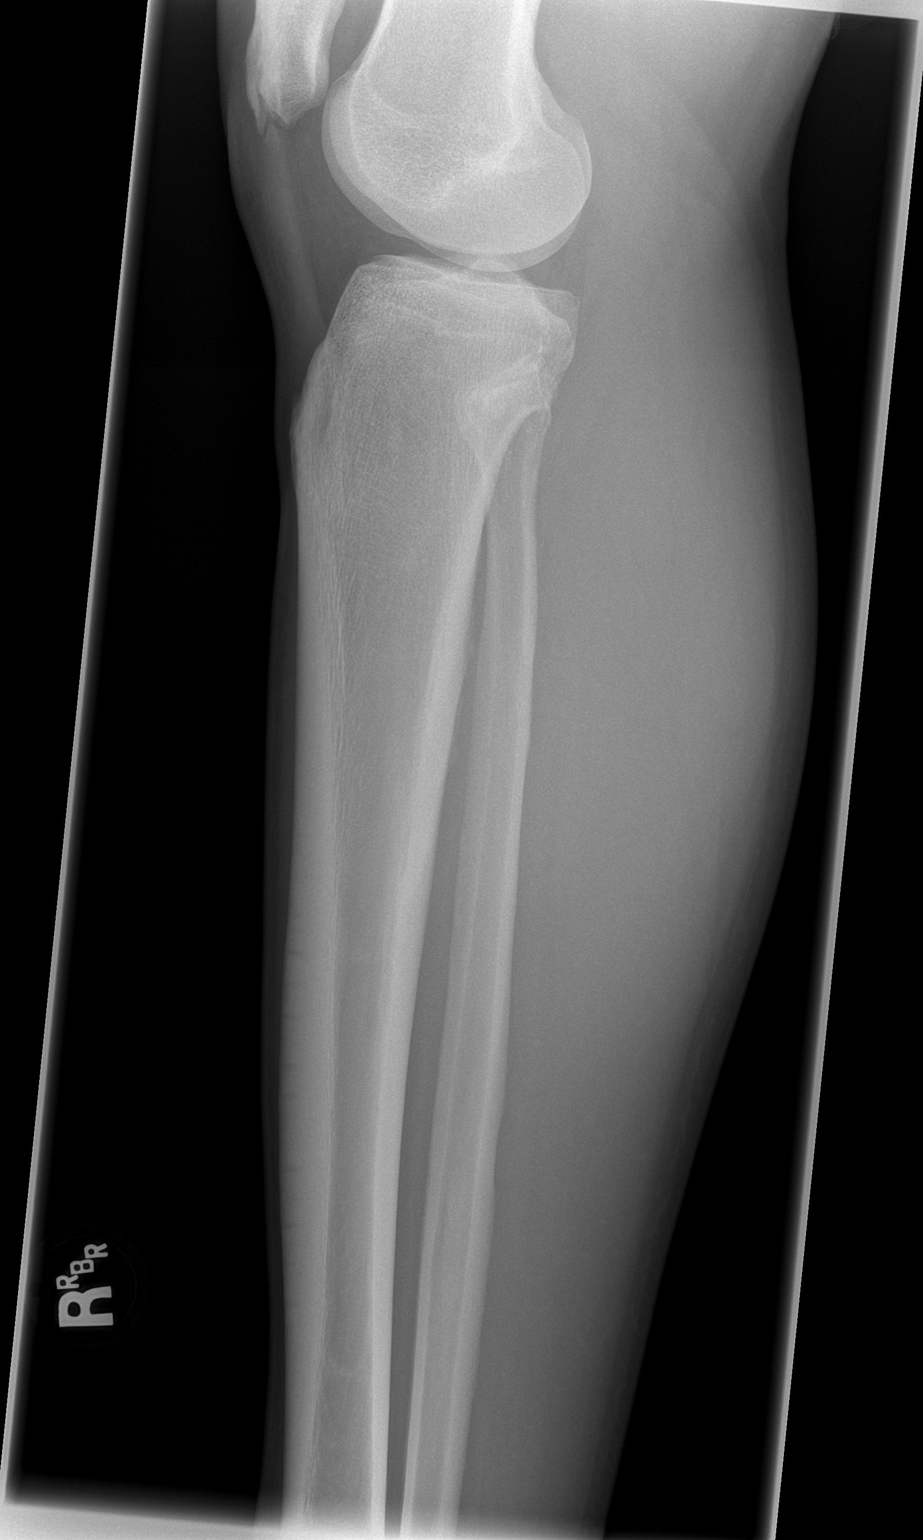

[2 of 2 positions shown; findings below may reference images not displayed]

FINDINGS: Osseous mineralization normal.

Joint spaces preserved.

Small patellar spur at patellar tendon insertion.

Striated linear lucencies are seen within the anterior cortex of the
RIGHT tibia compatible without definite cortical discontinuity or
overlying periosteal reaction most consistent with stress related
injury/changes of the anterior cortex.

No acute fracture, dislocation or bone destruction.
IMPRESSION: No acute ankle abnormalities are identified; if there is clinical
concern for an Achilles injury recommend MR.

Morelli linear cortical lucencies at the anterior tibial
cortex without completed fracture.

## 2020-04-23 ENCOUNTER — Emergency Department (HOSPITAL_COMMUNITY)
Admission: EM | Admit: 2020-04-23 | Discharge: 2020-04-24 | Disposition: A | Discharge status: Home / Self Care | Payer: BC Managed Care – PPO | Attending: Emergency Medicine | Admitting: Emergency Medicine

## 2020-04-23 ENCOUNTER — Other Ambulatory Visit: Payer: Self-pay

## 2020-04-23 ENCOUNTER — Emergency Department (HOSPITAL_COMMUNITY): Payer: BC Managed Care – PPO

## 2020-04-23 DIAGNOSIS — I10 Essential (primary) hypertension: Secondary | ICD-10-CM | POA: Insufficient documentation

## 2020-04-23 DIAGNOSIS — R079 Chest pain, unspecified: Secondary | ICD-10-CM | POA: Diagnosis not present

## 2020-04-23 DIAGNOSIS — Z5321 Procedure and treatment not carried out due to patient leaving prior to being seen by health care provider: Secondary | ICD-10-CM | POA: Diagnosis not present

## 2020-04-23 DIAGNOSIS — R002 Palpitations: Secondary | ICD-10-CM | POA: Diagnosis not present

## 2020-04-23 LAB — BASIC METABOLIC PANEL
Anion gap: 11 (ref 5–15)
BUN: 15 mg/dL (ref 6–20)
CO2: 25 mmol/L (ref 22–32)
Calcium: 8.7 mg/dL — ABNORMAL LOW (ref 8.9–10.3)
Chloride: 95 mmol/L — ABNORMAL LOW (ref 98–111)
Creatinine, Ser: 1.37 mg/dL — ABNORMAL HIGH (ref 0.61–1.24)
GFR, Estimated: >60 mL/min (ref >60)
Glucose, Bld: 232 mg/dL — ABNORMAL HIGH (ref 70–99)
Potassium: 3.6 mmol/L (ref 3.5–5.1)
Sodium: 131 mmol/L — ABNORMAL LOW (ref 135–145)

## 2020-04-23 LAB — CBC
HCT: 44.7 % (ref 39.0–52.0)
Hemoglobin: 15 g/dL (ref 13.0–17.0)
MCH: 29.2 pg (ref 26.0–34.0)
MCHC: 33.6 g/dL (ref 30.0–36.0)
MCV: 87.1 fL (ref 80.0–100.0)
Platelets: 214 10*3/uL (ref 150–400)
RBC: 5.13 MIL/uL (ref 4.22–5.81)
RDW: 13.5 % (ref 11.5–15.5)
WBC: 5.4 10*3/uL (ref 4.0–10.5)
nRBC: 0 % (ref 0.0–0.2)

## 2020-04-23 LAB — TROPONIN I (HIGH SENSITIVITY): Troponin I (High Sensitivity): 4 ng/L (ref <18)

## 2020-04-23 NOTE — ED Triage Notes (Signed)
Patient stated felt palpitations earlier and called EMS: c/o high BP as well;

## 2020-04-23 NOTE — ED Notes (Signed)
Pt LWBS, moving OTF. 

## 2020-05-15 DIAGNOSIS — I10 Essential (primary) hypertension: Secondary | ICD-10-CM | POA: Diagnosis not present

## 2020-05-15 DIAGNOSIS — R7989 Other specified abnormal findings of blood chemistry: Secondary | ICD-10-CM | POA: Diagnosis not present

## 2020-05-20 DIAGNOSIS — I10 Essential (primary) hypertension: Secondary | ICD-10-CM | POA: Diagnosis not present

## 2020-05-20 DIAGNOSIS — R079 Chest pain, unspecified: Secondary | ICD-10-CM | POA: Diagnosis not present

## 2020-05-20 DIAGNOSIS — R0789 Other chest pain: Secondary | ICD-10-CM | POA: Diagnosis not present

## 2020-06-17 DIAGNOSIS — E1169 Type 2 diabetes mellitus with other specified complication: Secondary | ICD-10-CM | POA: Diagnosis not present

## 2020-06-17 DIAGNOSIS — I10 Essential (primary) hypertension: Secondary | ICD-10-CM | POA: Diagnosis not present

## 2021-01-06 DIAGNOSIS — R3911 Hesitancy of micturition: Secondary | ICD-10-CM | POA: Diagnosis not present

## 2021-01-06 DIAGNOSIS — E782 Mixed hyperlipidemia: Secondary | ICD-10-CM | POA: Diagnosis not present

## 2021-01-06 DIAGNOSIS — R300 Dysuria: Secondary | ICD-10-CM | POA: Diagnosis not present

## 2021-01-06 DIAGNOSIS — Z0000 Encounter for general adult medical examination without abnormal findings: Secondary | ICD-10-CM | POA: Diagnosis not present

## 2021-01-06 DIAGNOSIS — I10 Essential (primary) hypertension: Secondary | ICD-10-CM | POA: Diagnosis not present

## 2021-01-06 DIAGNOSIS — E1169 Type 2 diabetes mellitus with other specified complication: Secondary | ICD-10-CM | POA: Diagnosis not present

## 2021-01-06 DIAGNOSIS — Z125 Encounter for screening for malignant neoplasm of prostate: Secondary | ICD-10-CM | POA: Diagnosis not present

## 2021-07-14 DIAGNOSIS — E782 Mixed hyperlipidemia: Secondary | ICD-10-CM | POA: Diagnosis not present

## 2021-07-14 DIAGNOSIS — R809 Proteinuria, unspecified: Secondary | ICD-10-CM | POA: Diagnosis not present

## 2021-07-14 DIAGNOSIS — E1169 Type 2 diabetes mellitus with other specified complication: Secondary | ICD-10-CM | POA: Diagnosis not present

## 2021-07-14 DIAGNOSIS — I10 Essential (primary) hypertension: Secondary | ICD-10-CM | POA: Diagnosis not present

## 2022-01-07 DIAGNOSIS — Z0000 Encounter for general adult medical examination without abnormal findings: Secondary | ICD-10-CM | POA: Diagnosis not present

## 2022-01-07 DIAGNOSIS — E1169 Type 2 diabetes mellitus with other specified complication: Secondary | ICD-10-CM | POA: Diagnosis not present

## 2022-01-07 DIAGNOSIS — R809 Proteinuria, unspecified: Secondary | ICD-10-CM | POA: Diagnosis not present

## 2022-01-07 DIAGNOSIS — I10 Essential (primary) hypertension: Secondary | ICD-10-CM | POA: Diagnosis not present

## 2022-01-07 DIAGNOSIS — E782 Mixed hyperlipidemia: Secondary | ICD-10-CM | POA: Diagnosis not present

## 2022-01-07 DIAGNOSIS — Z125 Encounter for screening for malignant neoplasm of prostate: Secondary | ICD-10-CM | POA: Diagnosis not present

## 2022-07-20 DIAGNOSIS — E1169 Type 2 diabetes mellitus with other specified complication: Secondary | ICD-10-CM | POA: Diagnosis not present

## 2022-07-20 DIAGNOSIS — I10 Essential (primary) hypertension: Secondary | ICD-10-CM | POA: Diagnosis not present

## 2022-07-20 DIAGNOSIS — E782 Mixed hyperlipidemia: Secondary | ICD-10-CM | POA: Diagnosis not present

## 2023-01-25 DIAGNOSIS — I10 Essential (primary) hypertension: Secondary | ICD-10-CM | POA: Diagnosis not present

## 2023-01-25 DIAGNOSIS — Z0000 Encounter for general adult medical examination without abnormal findings: Secondary | ICD-10-CM | POA: Diagnosis not present

## 2023-01-25 DIAGNOSIS — E782 Mixed hyperlipidemia: Secondary | ICD-10-CM | POA: Diagnosis not present

## 2023-01-25 DIAGNOSIS — E1169 Type 2 diabetes mellitus with other specified complication: Secondary | ICD-10-CM | POA: Diagnosis not present

## 2023-01-25 DIAGNOSIS — R809 Proteinuria, unspecified: Secondary | ICD-10-CM | POA: Diagnosis not present

## 2023-01-25 DIAGNOSIS — Z125 Encounter for screening for malignant neoplasm of prostate: Secondary | ICD-10-CM | POA: Diagnosis not present

## 2023-07-06 ENCOUNTER — Telehealth: Payer: Self-pay | Admitting: Pharmacist

## 2023-07-06 NOTE — Progress Notes (Addendum)
   07/06/2023  Patient ID: Darryl Fritz, male   DOB: Sep 15, 1965, 58 y.o.   MRN: [id number]  Patient was identified for DM counseling based on A1c greater than 8% on last lab results.   Tried calling patient today to schedule free DM initial visit me as the pharmacist, with all changes made in collaboration with the PCP. Left HIPAA compliant voicemail requesting call back at earliest convenience to schedule. If patient returns call to office, you can transfer them to my line at [phone]. Thank you!  Attempt #1 (out of 3)  Update 07/13/23: - Call patient and left voicemail requesting call back at earliest convenience. 2nd attempt.    Ricka Burdock, PharmD [facility]  Phone Number: [phone]

## 2023-07-27 DIAGNOSIS — E782 Mixed hyperlipidemia: Secondary | ICD-10-CM | POA: Diagnosis not present

## 2023-07-27 DIAGNOSIS — I10 Essential (primary) hypertension: Secondary | ICD-10-CM | POA: Diagnosis not present

## 2023-07-27 DIAGNOSIS — E1169 Type 2 diabetes mellitus with other specified complication: Secondary | ICD-10-CM | POA: Diagnosis not present

## 2023-08-05 ENCOUNTER — Telehealth: Payer: Self-pay | Admitting: Pharmacist

## 2023-08-05 NOTE — Progress Notes (Signed)
   08/05/2023  Patient ID: Darryl Fritz, male   DOB: 04-09-1966, 58 y.o.   MRN: [id number]  Patient was identified for DM counseling based on A1c greater than 8% on last lab results.   Tried calling patient today to schedule free DM initial visit me as the pharmacist, with all changes made in collaboration with the PCP. Left HIPAA compliant voicemail requesting call back at earliest convenience to schedule. If patient returns call to office, you can transfer them to my line at [phone]. Thank you!  Attempt #3 (out of 3). Final attempt.    Delvin File, PharmD [facility]  Phone Number: [phone]

## 2024-01-26 DIAGNOSIS — Z6829 Body mass index (BMI) 29.0-29.9, adult: Secondary | ICD-10-CM | POA: Diagnosis not present

## 2024-01-26 DIAGNOSIS — E1169 Type 2 diabetes mellitus with other specified complication: Secondary | ICD-10-CM | POA: Diagnosis not present

## 2024-01-26 DIAGNOSIS — I10 Essential (primary) hypertension: Secondary | ICD-10-CM | POA: Diagnosis not present

## 2024-01-26 DIAGNOSIS — R809 Proteinuria, unspecified: Secondary | ICD-10-CM | POA: Diagnosis not present

## 2024-01-26 DIAGNOSIS — Z125 Encounter for screening for malignant neoplasm of prostate: Secondary | ICD-10-CM | POA: Diagnosis not present

## 2024-01-26 DIAGNOSIS — Z0000 Encounter for general adult medical examination without abnormal findings: Secondary | ICD-10-CM | POA: Diagnosis not present

## 2024-01-26 DIAGNOSIS — E782 Mixed hyperlipidemia: Secondary | ICD-10-CM | POA: Diagnosis not present
